# Patient Record
Sex: Male | Born: 1965 | Race: Black or African American | Hispanic: No | Marital: Single | Smoking: Never smoker
Health system: Southern US, Community
[De-identification: ages and names within clinical notes are randomized; demographics above are authoritative.]

## PROBLEM LIST (undated history)

## (undated) DIAGNOSIS — N289 Disorder of kidney and ureter, unspecified: Secondary | ICD-10-CM

## (undated) DIAGNOSIS — K469 Unspecified abdominal hernia without obstruction or gangrene: Secondary | ICD-10-CM

---

## 2001-08-13 ENCOUNTER — Encounter: Payer: Self-pay | Admitting: Emergency Medicine

## 2001-08-13 ENCOUNTER — Emergency Department (HOSPITAL_COMMUNITY): Admission: EM | Admit: 2001-08-13 | Discharge: 2001-08-13 | Payer: Self-pay | Admitting: Emergency Medicine

## 2001-08-18 ENCOUNTER — Encounter: Payer: Self-pay | Admitting: Urology

## 2001-08-18 ENCOUNTER — Ambulatory Visit (HOSPITAL_COMMUNITY): Admission: RE | Admit: 2001-08-18 | Discharge: 2001-08-18 | Payer: Self-pay | Admitting: Urology

## 2006-10-24 ENCOUNTER — Emergency Department (HOSPITAL_COMMUNITY): Admission: EM | Admit: 2006-10-24 | Discharge: 2006-10-24 | Payer: Self-pay | Admitting: Emergency Medicine

## 2009-03-01 ENCOUNTER — Emergency Department (HOSPITAL_COMMUNITY): Admission: EM | Admit: 2009-03-01 | Discharge: 2009-03-02 | Payer: Self-pay | Admitting: Emergency Medicine

## 2011-03-28 ENCOUNTER — Ambulatory Visit (HOSPITAL_COMMUNITY)
Admission: RE | Admit: 2011-03-28 | Discharge: 2011-03-28 | Disposition: A | Discharge status: Home / Self Care | Payer: BC Managed Care – PPO | Source: Ambulatory Visit | Attending: Internal Medicine | Admitting: Internal Medicine

## 2011-03-28 ENCOUNTER — Other Ambulatory Visit (HOSPITAL_COMMUNITY): Payer: Self-pay | Admitting: Internal Medicine

## 2011-03-28 DIAGNOSIS — R0789 Other chest pain: Secondary | ICD-10-CM | POA: Insufficient documentation

## 2011-04-01 ENCOUNTER — Emergency Department (HOSPITAL_COMMUNITY)
Admission: EM | Admit: 2011-04-01 | Discharge: 2011-04-01 | Disposition: A | Discharge status: Home / Self Care | Payer: BC Managed Care – PPO | Attending: Emergency Medicine | Admitting: Emergency Medicine

## 2011-04-01 ENCOUNTER — Emergency Department (HOSPITAL_COMMUNITY): Payer: BC Managed Care – PPO

## 2011-04-01 DIAGNOSIS — Z79899 Other long term (current) drug therapy: Secondary | ICD-10-CM | POA: Insufficient documentation

## 2011-04-01 DIAGNOSIS — R079 Chest pain, unspecified: Secondary | ICD-10-CM | POA: Insufficient documentation

## 2011-04-01 DIAGNOSIS — F17200 Nicotine dependence, unspecified, uncomplicated: Secondary | ICD-10-CM | POA: Insufficient documentation

## 2011-04-01 LAB — POCT CARDIAC MARKERS
CKMB, poc: 4.1 ng/mL (ref 1.0–8.0)
Troponin i, poc: <0.05 ng/mL (ref 0.00–0.09)

## 2018-03-01 ENCOUNTER — Other Ambulatory Visit (HOSPITAL_COMMUNITY): Payer: Self-pay | Admitting: Preventative Medicine

## 2018-03-01 DIAGNOSIS — R197 Diarrhea, unspecified: Secondary | ICD-10-CM

## 2018-03-01 DIAGNOSIS — R1032 Left lower quadrant pain: Secondary | ICD-10-CM

## 2018-03-03 ENCOUNTER — Other Ambulatory Visit: Payer: Self-pay

## 2018-03-03 ENCOUNTER — Encounter (HOSPITAL_COMMUNITY): Payer: Self-pay | Admitting: Emergency Medicine

## 2018-03-03 ENCOUNTER — Emergency Department (HOSPITAL_COMMUNITY): Payer: BC Managed Care – PPO

## 2018-03-03 ENCOUNTER — Emergency Department (HOSPITAL_COMMUNITY)
Admission: EM | Admit: 2018-03-03 | Discharge: 2018-03-03 | Disposition: A | Discharge status: Other Acute Inpatient Hospital | Payer: BC Managed Care – PPO | Attending: Emergency Medicine | Admitting: Emergency Medicine

## 2018-03-03 DIAGNOSIS — R0602 Shortness of breath: Secondary | ICD-10-CM | POA: Insufficient documentation

## 2018-03-03 DIAGNOSIS — R0789 Other chest pain: Secondary | ICD-10-CM

## 2018-03-03 DIAGNOSIS — D72829 Elevated white blood cell count, unspecified: Secondary | ICD-10-CM | POA: Diagnosis not present

## 2018-03-03 HISTORY — DX: Unspecified abdominal hernia without obstruction or gangrene: K46.9

## 2018-03-03 HISTORY — DX: Disorder of kidney and ureter, unspecified: N28.9

## 2018-03-03 LAB — BASIC METABOLIC PANEL
Anion gap: 12 (ref 5–15)
BUN: 17 mg/dL (ref 6–20)
CALCIUM: 9 mg/dL (ref 8.9–10.3)
CO2: 22 mmol/L (ref 22–32)
CREATININE: 1.11 mg/dL (ref 0.61–1.24)
Chloride: 106 mmol/L (ref 101–111)
GFR calc Af Amer: >60 mL/min (ref >60)
GFR calc non Af Amer: >60 mL/min (ref >60)
GLUCOSE: 110 mg/dL — AB (ref 65–99)
Potassium: 3.3 mmol/L — ABNORMAL LOW (ref 3.5–5.1)
Sodium: 140 mmol/L (ref 135–145)

## 2018-03-03 LAB — CBC
HCT: 33.6 % — ABNORMAL LOW (ref 39.0–52.0)
Hemoglobin: 11.1 g/dL — ABNORMAL LOW (ref 13.0–17.0)
MCH: 31.4 pg (ref 26.0–34.0)
MCHC: 33 g/dL (ref 30.0–36.0)
MCV: 94.9 fL (ref 78.0–100.0)
PLATELETS: 470 10*3/uL — AB (ref 150–400)
RBC: 3.54 MIL/uL — ABNORMAL LOW (ref 4.22–5.81)
RDW: 17.6 % — ABNORMAL HIGH (ref 11.5–15.5)
WBC: 364.6 10*3/uL (ref 4.0–10.5)

## 2018-03-03 LAB — DIFFERENTIAL
BAND NEUTROPHILS: 9 %
Basophils Absolute: 0 10*3/uL (ref 0.0–0.1)
Basophils Relative: 0 %
Blasts: 0 %
EOS ABS: 3.6 10*3/uL — AB (ref 0.0–0.7)
Eosinophils Relative: 1 %
LYMPHS ABS: 10.9 10*3/uL — AB (ref 0.7–4.0)
LYMPHS PCT: 3 %
MYELOCYTES: 5 %
Metamyelocytes Relative: 4 %
Monocytes Absolute: 0 10*3/uL — ABNORMAL LOW (ref 0.1–1.0)
Monocytes Relative: 0 %
Neutro Abs: 288 10*3/uL — ABNORMAL HIGH (ref 1.7–7.7)
Neutrophils Relative %: 40 %
OTHER: 17 %
Promyelocytes Relative: 21 %
nRBC: 1 /100 WBC — ABNORMAL HIGH

## 2018-03-03 LAB — I-STAT TROPONIN, ED: TROPONIN I, POC: 0 ng/mL (ref 0.00–0.08)

## 2018-03-03 LAB — BRAIN NATRIURETIC PEPTIDE: B NATRIURETIC PEPTIDE 5: 68 pg/mL (ref 0.0–100.0)

## 2018-03-03 LAB — HEPATIC FUNCTION PANEL
ALT: 88 U/L — ABNORMAL HIGH (ref 17–63)
AST: 80 U/L — ABNORMAL HIGH (ref 15–41)
Albumin: 3.7 g/dL (ref 3.5–5.0)
Alkaline Phosphatase: 312 U/L — ABNORMAL HIGH (ref 38–126)
Bilirubin, Direct: 0.7 mg/dL — ABNORMAL HIGH (ref 0.1–0.5)
Indirect Bilirubin: 1.2 mg/dL — ABNORMAL HIGH (ref 0.3–0.9)
Total Bilirubin: 1.9 mg/dL — ABNORMAL HIGH (ref 0.3–1.2)
Total Protein: 6.3 g/dL — ABNORMAL LOW (ref 6.5–8.1)

## 2018-03-03 LAB — LACTIC ACID, PLASMA: LACTIC ACID, VENOUS: 1.3 mmol/L (ref 0.5–1.9)

## 2018-03-03 MED ORDER — IOPAMIDOL (ISOVUE-300) INJECTION 61%: 75.0000 mL | Freq: Once | INTRAVENOUS | Status: DC | PRN

## 2018-03-03 MED ORDER — LEVOFLOXACIN IN D5W 500 MG/100ML IV SOLN
500.0000 mg | Freq: Once | INTRAVENOUS | Status: AC
  Administered 2018-03-03: 500 mg via INTRAVENOUS
  Filled 2018-03-03: qty 100

## 2018-03-03 MED ORDER — IOPAMIDOL (ISOVUE-300) INJECTION 61%
75.0000 mL | Freq: Once | INTRAVENOUS | Status: AC | PRN
  Administered 2018-03-03: 75 mL via INTRAVENOUS

## 2018-03-03 NOTE — ED Notes (Signed)
Patient transported to X-ray 

## 2018-03-03 NOTE — ED Notes (Signed)
Girlfriend came to desk and states pt is refusing to be transferred. Pt advised of risk , edp aware.

## 2018-03-03 NOTE — ED Notes (Addendum)
CRITICAL VALUE ALERT  Critical Value:  WBC 364.6 and immature cells/blasts cells  Date & Time Notied:  03/03/18  1047  Provider Notified: Dr Roderic Palau  Orders Received/Actions taken:

## 2018-03-03 NOTE — ED Notes (Signed)
Warmed up lunch tray for patient consumption.

## 2018-03-03 NOTE — ED Triage Notes (Signed)
Pt c/o of central chest pain starting at 8 am this morning with at rest.

## 2018-03-03 NOTE — ED Provider Notes (Signed)
[facility] EMERGENCY DEPARTMENT Provider Note   CSN: [id number] Arrival date & time: 03/03/18  0626     History   Chief Complaint Chief Complaint  Patient presents with  . Chest Pain    HPI WASIF SIMONICH is a 52 y.o. male.  Patient complains of chest pain and shortness of breath and some fevers and chills.  The history is provided by the patient.  Illness  This is a new problem. The current episode started more than 1 week ago. The problem occurs constantly. Associated symptoms include chest pain and shortness of breath. Pertinent negatives include no abdominal pain and no headaches. Nothing aggravates the symptoms. Nothing relieves the symptoms. He has tried nothing for the symptoms. The treatment provided no relief.    Past Medical History:  Diagnosis Date  . Hernia, abdominal   . Renal disorder     There are no active problems to display for this patient.   History reviewed. No pertinent surgical history.      Home Medications    Prior to Admission medications   Medication Sig Start Date End Date Taking? Authorizing Provider  ibuprofen (ADVIL,MOTRIN) 400 MG tablet Take 400 mg by mouth every 6 (six) hours as needed.   Yes [provider]    Family History No family history on file.  Social History Social History   Tobacco Use  . Smoking status: Never Smoker  . Smokeless tobacco: Never Used  Substance Use Topics  . Alcohol use: Yes    Comment: occ  . Drug use: Never     Allergies   Patient has no allergy information on record.   Review of Systems Review of Systems  Constitutional: Positive for fatigue and fever. Negative for appetite change.  HENT: Negative for congestion, ear discharge and sinus pressure.   Eyes: Negative for discharge.  Respiratory: Positive for shortness of breath. Negative for cough.   Cardiovascular: Positive for chest pain.  Gastrointestinal: Negative for abdominal pain and diarrhea.  Genitourinary: Negative  for frequency and hematuria.  Musculoskeletal: Negative for back pain.  Skin: Negative for rash.  Neurological: Negative for seizures and headaches.  Psychiatric/Behavioral: Negative for hallucinations.     Physical Exam Updated Vital Signs BP (!) 138/93   Pulse (!) 109   Temp 97.6 F (36.4 C) (Oral)   Resp (!) 23   Ht 5\' 7"  (1.702 m)   Wt 104.3 kg (230 lb)   SpO2 (!) 88%   BMI 36.02 kg/m   Physical Exam  Constitutional: He is oriented to person, place, and time. He appears well-developed.  HENT:  Head: Normocephalic.  Eyes: Conjunctivae and EOM are normal. No scleral icterus.  Neck: Neck supple. No thyromegaly present.  Cardiovascular: Normal rate and regular rhythm. Exam reveals no gallop and no friction rub.  No murmur heard. Pulmonary/Chest: No stridor. He has no wheezes. He has no rales. He exhibits no tenderness.  Abdominal: He exhibits no distension. There is no tenderness. There is no rebound.  Musculoskeletal: Normal range of motion. He exhibits no edema.  Lymphadenopathy:    He has no cervical adenopathy.  Neurological: He is oriented to person, place, and time. He exhibits normal muscle tone. Coordination normal.  Skin: No rash noted. No erythema.  Psychiatric: He has a normal mood and affect. His behavior is normal.     ED Treatments / Results  Labs (all labs ordered are listed, but only abnormal results are displayed) Labs Reviewed  BASIC METABOLIC PANEL - Abnormal;  Notable for the following components:      Result Value   Potassium 3.3 (*)    Glucose, Bld 110 (*)    All other components within normal limits  CBC - Abnormal; Notable for the following components:   WBC 364.6 (*)    RBC 3.54 (*)    Hemoglobin 11.1 (*)    HCT 33.6 (*)    RDW 17.6 (*)    Platelets 470 (*)    All other components within normal limits  DIFFERENTIAL - Abnormal; Notable for the following components:   nRBC 1 (*)    Neutro Abs 288.0 (*)    Lymphs Abs 10.9 (*)     Monocytes Absolute 0.0 (*)    Eosinophils Absolute 3.6 (*)    All other components within normal limits  HEPATIC FUNCTION PANEL - Abnormal; Notable for the following components:   Total Protein 6.3 (*)    AST 80 (*)    ALT 88 (*)    Alkaline Phosphatase 312 (*)    Total Bilirubin 1.9 (*)    Bilirubin, Direct 0.7 (*)    Indirect Bilirubin 1.2 (*)    All other components within normal limits  CULTURE, BLOOD (ROUTINE X 2)  CULTURE, BLOOD (ROUTINE X 2)  LACTIC ACID, PLASMA  BRAIN NATRIURETIC PEPTIDE  PATHOLOGIST SMEAR REVIEW  I-STAT TROPONIN, ED    EKG EKG Interpretation  Date/Time:  Wednesday March 03 2018 09:57:23 EDT Ventricular Rate:  111 PR Interval:    QRS Duration: 90 QT Interval:  346 QTC Calculation: 471 R Axis:   140 Text Interpretation:  Sinus tachycardia Biatrial enlargement Right ventricular hypertrophy Nonspecific T abnormalities, lateral leads Baseline wander in lead(s) II III aVR aVF Confirmed by Milton Ferguson [phone]) on 03/03/2018 10:31:28 AM   Radiology Dg Chest 2 View  Result Date: 03/03/2018 CLINICAL DATA:  52 year old male with cough and shortness of breath this week. Intermittent chest pain. EXAM: CHEST - 2 VIEW COMPARISON:  Chest radiographs 04/01/2011 and earlier. FINDINGS: PA and lateral views. Cardiac silhouette is increased since 2012 compatible with mild to moderate cardiomegaly now. Other mediastinal contours are within normal limits. Lung volumes are large, and there is new widespread bilateral pulmonary interstitial opacity. No pneumothorax, pleural effusion or consolidation. Visualized tracheal air column is within normal limits. No acute osseous abnormality identified. Negative visible bowel gas pattern. IMPRESSION: 1. Diffuse bilateral pulmonary interstitial opacity is new since 2012. Consider viral/atypical respiratory infection. Chronic interstitial lung disease felt less likely. No pleural effusion. 2. New mild to moderate cardiomegaly since 2012.  Electronically Signed   By: Genevie Ann M.D.   On: 03/03/2018 10:53   Ct Chest W Contrast  Result Date: 03/03/2018 CLINICAL DATA:  52 year old male with history of central chest pain since 8 a.m. this morning at rest. Cough and congestion for the past 2 weeks. EXAM: CT CHEST WITH CONTRAST TECHNIQUE: Multidetector CT imaging of the chest was performed during intravenous contrast administration. CONTRAST:  19mL ISOVUE-300 IOPAMIDOL (ISOVUE-300) INJECTION 61% COMPARISON:  No priors. FINDINGS: Cardiovascular: Heart size is mildly enlarged. There is no significant pericardial fluid, thickening or pericardial calcification. No atherosclerotic calcifications are noted in the thoracic aorta or the coronary arteries. Mediastinum/Nodes: Multiple prominent borderline enlarged mediastinal and bilateral hilar lymph nodes measuring up to 12 mm in short axis in the right paratracheal nodal stations and up to 14 mm in the left hilar nodal station. Esophagus is unremarkable in appearance. Multiple prominent borderline enlarged axillary lymph nodes are also noted (nonspecific).  Lungs/Pleura: Diffuse but patchy areas of ground-glass attenuation scattered throughout the lungs bilaterally, most severe throughout the mid to upper lungs. No associated septal thickening. Scattered areas of scarring or subsegmental atelectasis in the upper lobes of the lungs bilaterally. No pleural effusions. A few small pulmonary nodules are noted, measuring up to 6 x 4 mm (5 mm mean diameter) in the left lower lobe (axial image 106 of series 4). Upper Abdomen: Diffuse low-attenuation throughout the visualized hepatic parenchyma, strongly suggestive of hepatic steatosis (difficult to confirm on today's noncontrast CT examination). Liver also has a slightly nodular contour, suggesting concurrent cirrhosis. Trace volume of perihepatic ascites. Spleen is incompletely visualize, but clearly enlarged measuring 17.6 x 6.2 cm. Musculoskeletal: Bilateral  gynecomastia. There are no aggressive appearing lytic or blastic lesions noted in the visualized portions of the skeleton. IMPRESSION: 1. Unusual appearance of the lung parenchyma with diffuse but patchy areas of ground-glass attenuation which have a mid to upper lung predominance. Differential considerations are broad, but include atypical infectious etiologies including pneumocystis jirovecii pneumonia. 2. In addition, there is cardiomegaly, but no atherosclerotic calcifications in the thoracic aorta or the coronary arteries. The possibility of nonischemic cardiomyopathy should be considered. This can be seen in the setting of systemic disease such as HIV infection or sarcoidosis. 3. Multiple borderline enlarged lymph nodes throughout the mediastinal and bilateral hilar nodal stations, as well as the axillary regions bilaterally. This also can be seen in the setting of chronic systemic disease such as HIV or sarcoidosis. The possibility of lymphoproliferative disorder could also be considered. Further evaluation is recommended. 4. Cirrhosis with splenomegaly, suggesting associated portal venous hypertension. 5. Bilateral gynecomastia. 6. Trace volume of ascites. Electronically Signed   By: Vinnie Langton M.D.   On: 03/03/2018 12:28    Procedures Procedures (including critical care time)  Medications Ordered in ED Medications  iopamidol (ISOVUE-300) 61 % injection 75 mL (has no administration in time range)  levofloxacin (LEVAQUIN) IVPB 500 mg (0 mg Intravenous Stopped 03/03/18 1348)  iopamidol (ISOVUE-300) 61 % injection 75 mL (75 mLs Intravenous Contrast Given 03/03/18 1140)    CRITICAL CARE Performed by: Milton Ferguson Total critical care time: 40 minutes Critical care time was exclusive of separately billable procedures and treating other patients. Critical care was necessary to treat or prevent imminent or life-threatening deterioration. Critical care was time spent personally by me on the  following activities: development of treatment plan with patient and/or surrogate as well as nursing, discussions with consultants, evaluation of patient's response to treatment, examination of patient, obtaining history from patient or surrogate, ordering and performing treatments and interventions, ordering and review of laboratory studies, ordering and review of radiographic studies, pulse oximetry and re-evaluation of patient's condition.  Initial Impression / Assessment and Plan / ED Course  I have reviewed the triage vital signs and the nursing notes.  Pertinent labs & imaging results that were available during my care of the patient were reviewed by me and considered in my medical decision making (see chart for details). 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  Chest x-ray and CT scan of his chest suggests infectious agent possibly atypical infection or fungus.  I spoke with the hematologist oncologist over at [facility] Dr. Joan Mayans and the patient is accepted to [facility].    Final Clinical Impressions(s) / ED Diagnoses   Final diagnoses:  None    ED Discharge Orders    None       Milton Ferguson, MD  03/03/18 1542  

## 2018-03-03 NOTE — ED Notes (Signed)
Called [facility] for Transportation to [facility].

## 2018-03-03 NOTE — ED Notes (Signed)
Patient transported back from X-ray 

## 2018-03-03 NOTE — ED Notes (Signed)
In with edp to talk to pt and girlfriend. Advised of transfer. Pt signed consent form.

## 2018-03-05 LAB — PATHOLOGIST SMEAR REVIEW

## 2018-03-08 LAB — CULTURE, BLOOD (ROUTINE X 2)
CULTURE: NO GROWTH
Culture: NO GROWTH
SPECIAL REQUESTS: ADEQUATE

## 2018-03-15 ENCOUNTER — Other Ambulatory Visit (HOSPITAL_COMMUNITY): Payer: Self-pay

## 2018-03-15 DIAGNOSIS — C9210 Chronic myeloid leukemia, BCR/ABL-positive, not having achieved remission: Secondary | ICD-10-CM

## 2018-03-22 ENCOUNTER — Inpatient Hospital Stay (HOSPITAL_COMMUNITY): Payer: BC Managed Care – PPO | Attending: Hematology

## 2018-03-22 ENCOUNTER — Other Ambulatory Visit (HOSPITAL_COMMUNITY)
Admission: RE | Admit: 2018-03-22 | Discharge: 2018-03-22 | Disposition: A | Discharge status: Home / Self Care | Payer: BC Managed Care – PPO | Source: Ambulatory Visit | Attending: Hematology and Oncology | Admitting: Hematology and Oncology

## 2018-03-22 DIAGNOSIS — C9210 Chronic myeloid leukemia, BCR/ABL-positive, not having achieved remission: Secondary | ICD-10-CM

## 2018-03-22 DIAGNOSIS — C9000 Multiple myeloma not having achieved remission: Secondary | ICD-10-CM | POA: Diagnosis present

## 2018-03-22 DIAGNOSIS — D500 Iron deficiency anemia secondary to blood loss (chronic): Secondary | ICD-10-CM | POA: Diagnosis not present

## 2018-03-22 DIAGNOSIS — C9220 Atypical chronic myeloid leukemia, BCR/ABL-negative, not having achieved remission: Secondary | ICD-10-CM | POA: Insufficient documentation

## 2018-03-22 LAB — COMPREHENSIVE METABOLIC PANEL
ALT: 30 U/L (ref 17–63)
AST: 25 U/L (ref 15–41)
Albumin: 3.7 g/dL (ref 3.5–5.0)
Alkaline Phosphatase: 128 U/L — ABNORMAL HIGH (ref 38–126)
Anion gap: 6 (ref 5–15)
BUN: 17 mg/dL (ref 6–20)
CHLORIDE: 104 mmol/L (ref 101–111)
CO2: 29 mmol/L (ref 22–32)
Calcium: 8.6 mg/dL — ABNORMAL LOW (ref 8.9–10.3)
Creatinine, Ser: 0.88 mg/dL (ref 0.61–1.24)
Glucose, Bld: 129 mg/dL — ABNORMAL HIGH (ref 65–99)
POTASSIUM: 3.9 mmol/L (ref 3.5–5.1)
Sodium: 139 mmol/L (ref 135–145)
Total Bilirubin: 0.9 mg/dL (ref 0.3–1.2)
Total Protein: 6.4 g/dL — ABNORMAL LOW (ref 6.5–8.1)

## 2018-03-22 LAB — CBC WITH DIFFERENTIAL/PLATELET
BASOS PCT: 4 %
Basophils Absolute: 0.3 10*3/uL — ABNORMAL HIGH (ref 0.0–0.1)
EOS ABS: 0.2 10*3/uL (ref 0.0–0.7)
Eosinophils Relative: 2 %
HCT: 33.1 % — ABNORMAL LOW (ref 39.0–52.0)
Hemoglobin: 10.1 g/dL — ABNORMAL LOW (ref 13.0–17.0)
Lymphocytes Relative: 11 %
Lymphs Abs: 0.8 10*3/uL (ref 0.7–4.0)
MCH: 29.8 pg (ref 26.0–34.0)
MCHC: 30.5 g/dL (ref 30.0–36.0)
MCV: 97.6 fL (ref 78.0–100.0)
MONO ABS: 0.3 10*3/uL (ref 0.1–1.0)
Monocytes Relative: 4 %
NEUTROS PCT: 79 %
Neutro Abs: 5.9 10*3/uL (ref 1.7–7.7)
PLATELETS: 321 10*3/uL (ref 150–400)
RBC: 3.39 MIL/uL — ABNORMAL LOW (ref 4.22–5.81)
RDW: 18.7 % — AB (ref 11.5–15.5)
WBC: 7.5 10*3/uL (ref 4.0–10.5)

## 2018-03-22 LAB — URIC ACID: URIC ACID, SERUM: 6.2 mg/dL (ref 4.4–7.6)

## 2018-03-22 LAB — LACTATE DEHYDROGENASE: LDH: 201 U/L — AB (ref 98–192)

## 2018-03-29 ENCOUNTER — Other Ambulatory Visit (HOSPITAL_COMMUNITY): Payer: Self-pay

## 2018-03-29 ENCOUNTER — Inpatient Hospital Stay (HOSPITAL_COMMUNITY): Payer: BC Managed Care – PPO

## 2018-03-29 DIAGNOSIS — D500 Iron deficiency anemia secondary to blood loss (chronic): Secondary | ICD-10-CM

## 2018-03-29 DIAGNOSIS — C9000 Multiple myeloma not having achieved remission: Secondary | ICD-10-CM

## 2018-03-29 LAB — CBC WITH DIFFERENTIAL/PLATELET
BASOS PCT: 1 %
Basophils Absolute: 0 10*3/uL (ref 0.0–0.1)
EOS ABS: 0.1 10*3/uL (ref 0.0–0.7)
EOS PCT: 2 %
HCT: 34.4 % — ABNORMAL LOW (ref 39.0–52.0)
HEMOGLOBIN: 10.4 g/dL — AB (ref 13.0–17.0)
LYMPHS ABS: 0.7 10*3/uL (ref 0.7–4.0)
Lymphocytes Relative: 18 %
MCH: 29.6 pg (ref 26.0–34.0)
MCHC: 30.2 g/dL (ref 30.0–36.0)
MCV: 98 fL (ref 78.0–100.0)
MONOS PCT: 6 %
Monocytes Absolute: 0.3 10*3/uL (ref 0.1–1.0)
NEUTROS PCT: 73 %
Neutro Abs: 2.9 10*3/uL (ref 1.7–7.7)
PLATELETS: 192 10*3/uL (ref 150–400)
RBC: 3.51 MIL/uL — ABNORMAL LOW (ref 4.22–5.81)
RDW: 19.4 % — AB (ref 11.5–15.5)
WBC: 3.9 10*3/uL — ABNORMAL LOW (ref 4.0–10.5)

## 2018-03-29 LAB — COMPREHENSIVE METABOLIC PANEL
ALBUMIN: 3.7 g/dL (ref 3.5–5.0)
ALT: 25 U/L (ref 17–63)
AST: 24 U/L (ref 15–41)
Alkaline Phosphatase: 112 U/L (ref 38–126)
Anion gap: 7 (ref 5–15)
BUN: 15 mg/dL (ref 6–20)
CHLORIDE: 102 mmol/L (ref 101–111)
CO2: 30 mmol/L (ref 22–32)
CREATININE: 0.85 mg/dL (ref 0.61–1.24)
Calcium: 8.5 mg/dL — ABNORMAL LOW (ref 8.9–10.3)
GFR calc Af Amer: >60 mL/min (ref >60)
GFR calc non Af Amer: >60 mL/min (ref >60)
GLUCOSE: 159 mg/dL — AB (ref 65–99)
Potassium: 4.1 mmol/L (ref 3.5–5.1)
SODIUM: 139 mmol/L (ref 135–145)
Total Bilirubin: 0.7 mg/dL (ref 0.3–1.2)
Total Protein: 6.3 g/dL — ABNORMAL LOW (ref 6.5–8.1)

## 2018-03-29 LAB — URIC ACID: Uric Acid, Serum: 5.7 mg/dL (ref 4.4–7.6)

## 2018-03-29 LAB — LACTATE DEHYDROGENASE: LDH: 157 U/L (ref 98–192)

## 2018-04-28 ENCOUNTER — Other Ambulatory Visit (HOSPITAL_COMMUNITY)
Admission: RE | Admit: 2018-04-28 | Discharge: 2018-04-28 | Disposition: A | Discharge status: Home / Self Care | Payer: BC Managed Care – PPO | Source: Ambulatory Visit | Attending: Internal Medicine | Admitting: Internal Medicine

## 2018-04-28 DIAGNOSIS — I5022 Chronic systolic (congestive) heart failure: Secondary | ICD-10-CM | POA: Diagnosis not present

## 2018-04-28 LAB — BASIC METABOLIC PANEL
Anion gap: 6 (ref 5–15)
BUN: 14 mg/dL (ref 6–20)
CHLORIDE: 103 mmol/L (ref 101–111)
CO2: 29 mmol/L (ref 22–32)
CREATININE: 0.84 mg/dL (ref 0.61–1.24)
Calcium: 9.2 mg/dL (ref 8.9–10.3)
GFR calc non Af Amer: >60 mL/min (ref >60)
Glucose, Bld: 119 mg/dL — ABNORMAL HIGH (ref 65–99)
POTASSIUM: 4.2 mmol/L (ref 3.5–5.1)
Sodium: 138 mmol/L (ref 135–145)

## 2018-05-03 ENCOUNTER — Other Ambulatory Visit (HOSPITAL_COMMUNITY)
Admission: RE | Admit: 2018-05-03 | Discharge: 2018-05-03 | Disposition: A | Discharge status: Home / Self Care | Payer: BC Managed Care – PPO | Source: Ambulatory Visit | Attending: Hematology and Oncology | Admitting: Hematology and Oncology

## 2018-05-03 DIAGNOSIS — C9210 Chronic myeloid leukemia, BCR/ABL-positive, not having achieved remission: Secondary | ICD-10-CM | POA: Insufficient documentation

## 2018-05-03 LAB — COMPREHENSIVE METABOLIC PANEL
ALBUMIN: 4 g/dL (ref 3.5–5.0)
ALK PHOS: 117 U/L (ref 38–126)
ALT: 21 U/L (ref 17–63)
AST: 25 U/L (ref 15–41)
Anion gap: 7 (ref 5–15)
BILIRUBIN TOTAL: 0.7 mg/dL (ref 0.3–1.2)
BUN: 17 mg/dL (ref 6–20)
CALCIUM: 8.9 mg/dL (ref 8.9–10.3)
CO2: 28 mmol/L (ref 22–32)
CREATININE: 1.06 mg/dL (ref 0.61–1.24)
Chloride: 104 mmol/L (ref 101–111)
GFR calc non Af Amer: >60 mL/min (ref >60)
GLUCOSE: 129 mg/dL — AB (ref 65–99)
Potassium: 4.2 mmol/L (ref 3.5–5.1)
SODIUM: 139 mmol/L (ref 135–145)
Total Protein: 6.8 g/dL (ref 6.5–8.1)

## 2018-05-03 LAB — CBC WITH DIFFERENTIAL/PLATELET
Basophils Absolute: 0 10*3/uL (ref 0.0–0.1)
Basophils Relative: 0 %
EOS ABS: 0.1 10*3/uL (ref 0.0–0.7)
Eosinophils Relative: 2 %
HEMATOCRIT: 41.5 % (ref 39.0–52.0)
HEMOGLOBIN: 12.9 g/dL — AB (ref 13.0–17.0)
LYMPHS ABS: 1.4 10*3/uL (ref 0.7–4.0)
Lymphocytes Relative: 25 %
MCH: 29.1 pg (ref 26.0–34.0)
MCHC: 31.1 g/dL (ref 30.0–36.0)
MCV: 93.7 fL (ref 78.0–100.0)
Monocytes Absolute: 0.3 10*3/uL (ref 0.1–1.0)
Monocytes Relative: 5 %
NEUTROS PCT: 68 %
Neutro Abs: 3.9 10*3/uL (ref 1.7–7.7)
Platelets: 294 10*3/uL (ref 150–400)
RBC: 4.43 MIL/uL (ref 4.22–5.81)
RDW: 16 % — ABNORMAL HIGH (ref 11.5–15.5)
WBC: 5.7 10*3/uL (ref 4.0–10.5)

## 2018-05-03 LAB — LACTATE DEHYDROGENASE: LDH: 135 U/L (ref 98–192)

## 2018-05-03 LAB — URIC ACID: Uric Acid, Serum: 5.2 mg/dL (ref 4.4–7.6)

## 2019-09-07 IMAGING — CT CT CHEST W/ CM
2 of 3 series · 15 of 36 positions shown, 18 images · IV contrast (iopamidol)
Comparison: No priors.

CLINICAL DATA: 51-year-old male with history of central chest pain
since 8 a.m. this morning at rest. Cough and congestion for the past
2 weeks.

EXAM:
CT CHEST WITH CONTRAST
TECHNIQUE: Multidetector CT imaging of the chest was performed during
intravenous contrast administration.
CONTRAST:  75mL RG6TAY-AII IOPAMIDOL (RG6TAY-AII) INJECTION 61%

[Series 2: axial st · axial · 0.71mm/px · z∈[-370,-60]mm · 12 of 183 slices shown, 15 images]
[im 14/183  mediastinal]
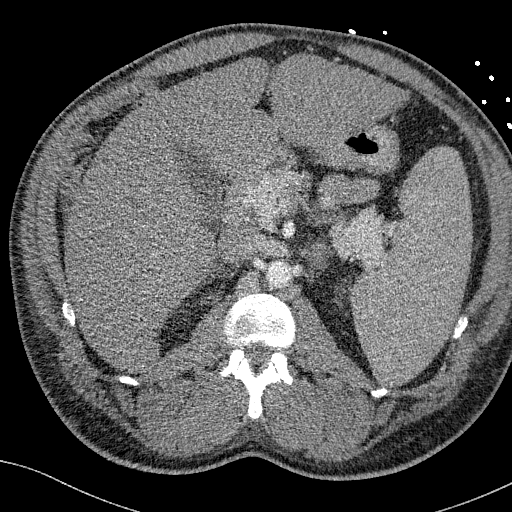
[im 14/183  lung]
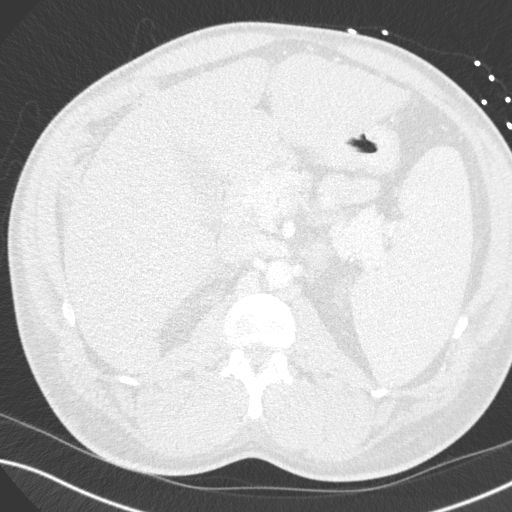
[im 27/183  lung]
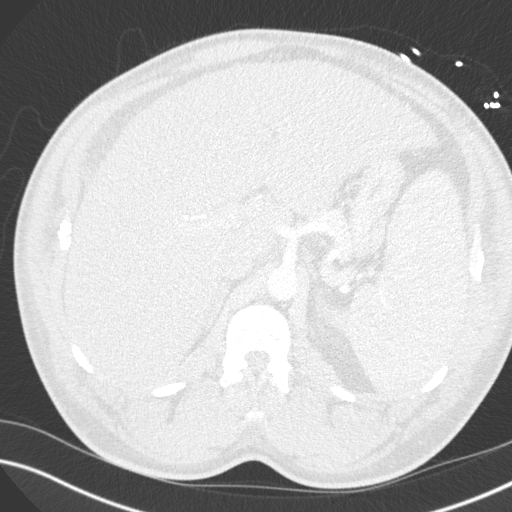
[im 41/183  lung]
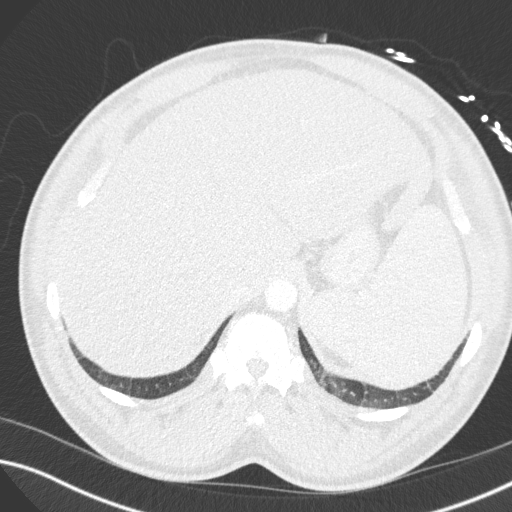
[im 54/183  lung]
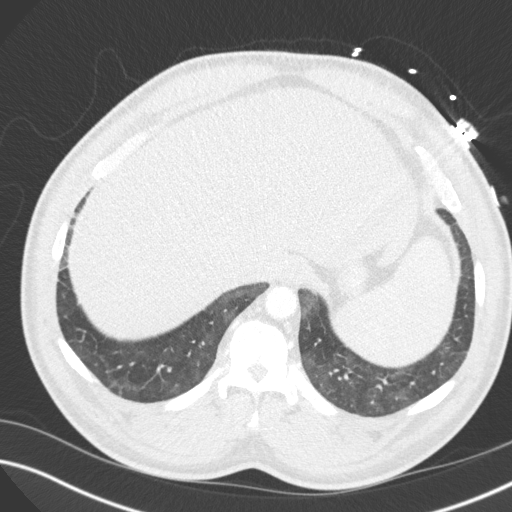
[im 68/183  mediastinal]
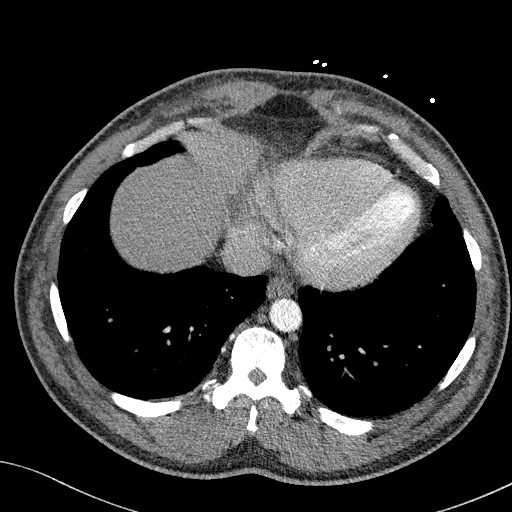
[im 68/183  lung]
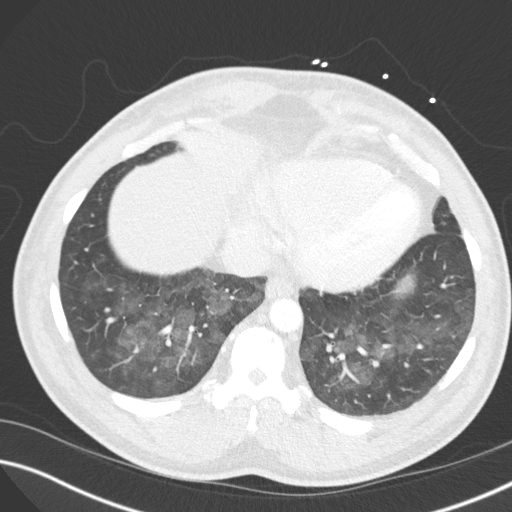
[im 81/183  lung]
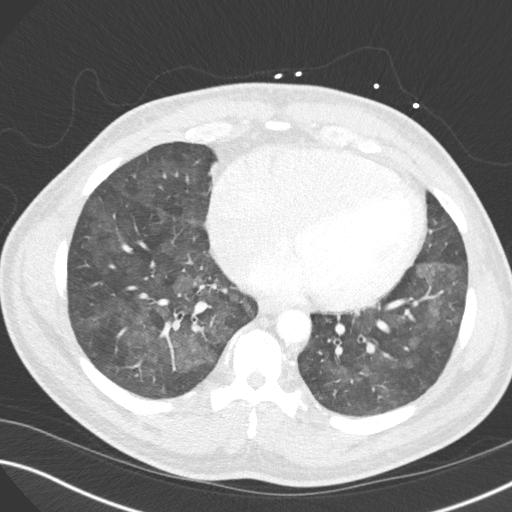
[im 102/183  lung]
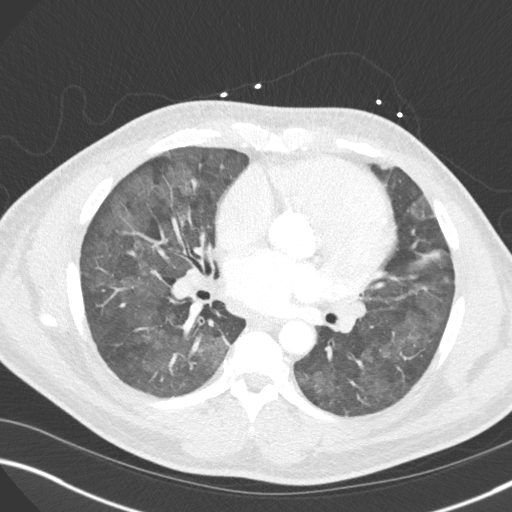
[im 115/183  lung]
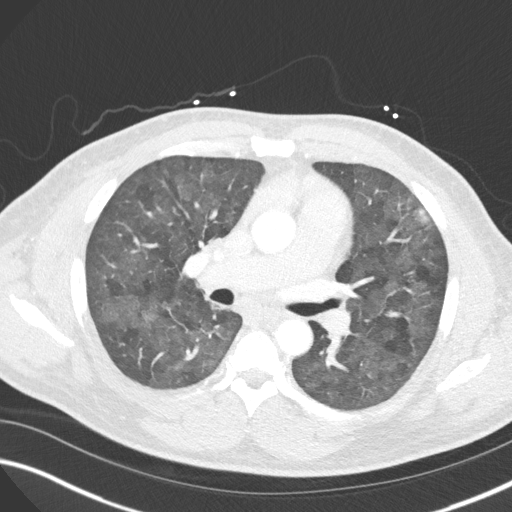
[im 129/183  mediastinal]
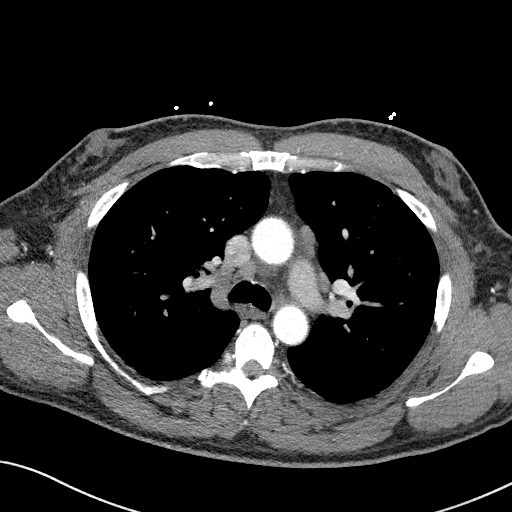
[im 129/183  lung]
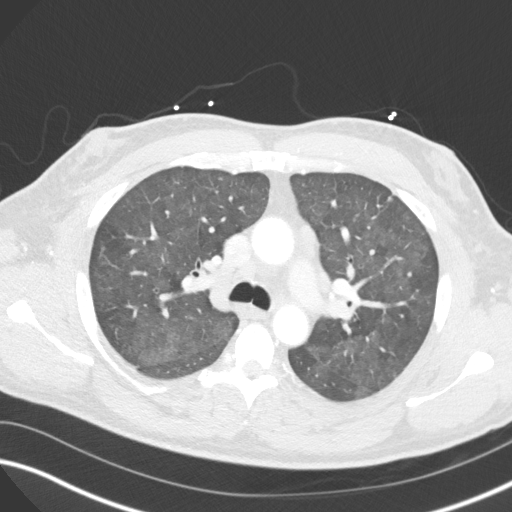
[im 142/183  lung]
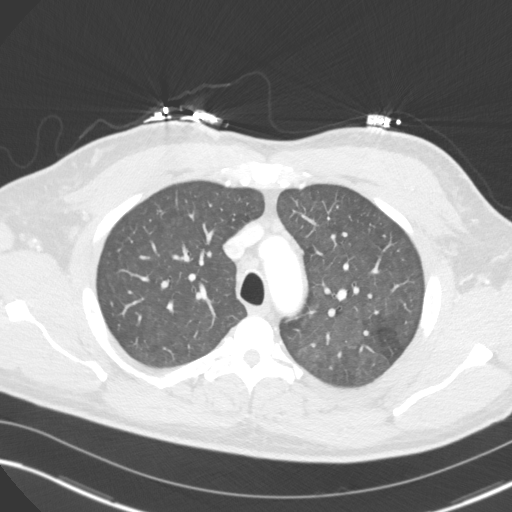
[im 156/183  lung]
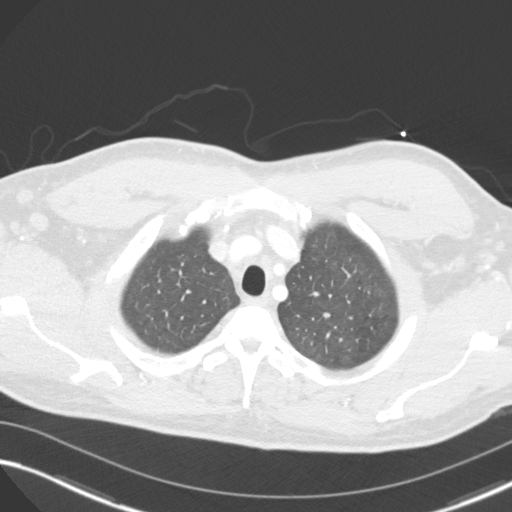
[im 169/183  lung]
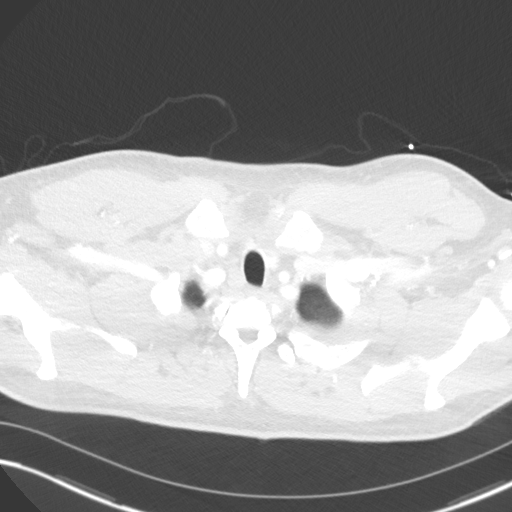

[Series 5: coronal · coronal · 0.71mm/px · 3 of 152 slices shown]
[im 31/152  lung]
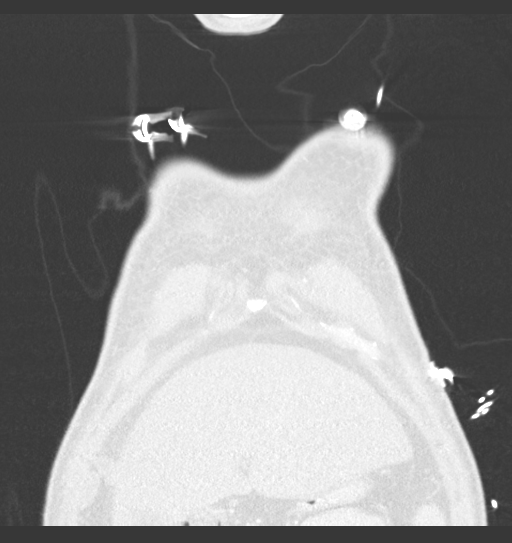
[im 61/152  lung]
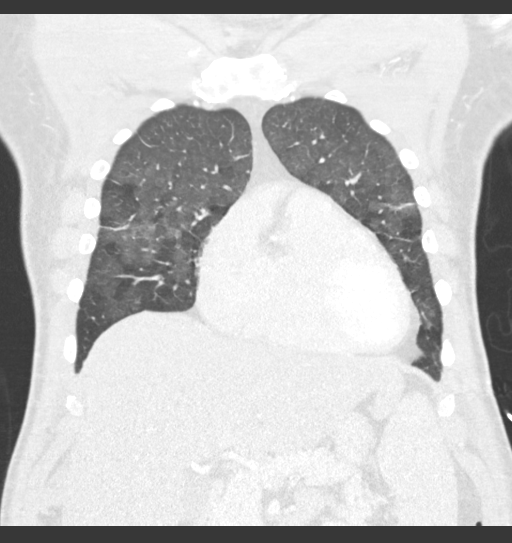
[im 91/152  lung]
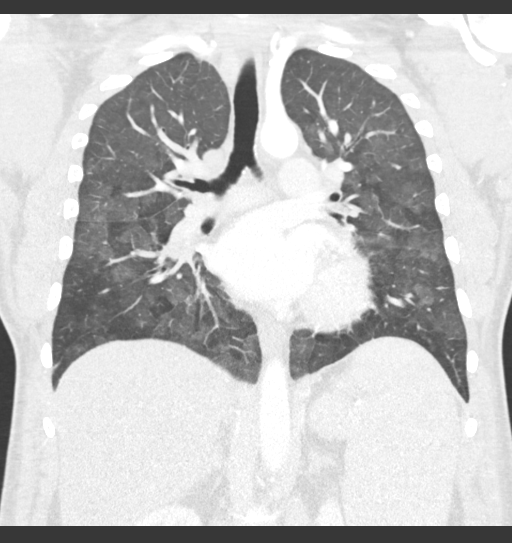

[15 of 36 positions shown; findings below may reference images not displayed]

FINDINGS: Cardiovascular: Heart size is mildly enlarged. There is no
significant pericardial fluid, thickening or pericardial
calcification. No atherosclerotic calcifications are noted in the
thoracic aorta or the coronary arteries.

Mediastinum/Nodes: Multiple prominent borderline enlarged
mediastinal and bilateral hilar lymph nodes measuring up to 12 mm in
short axis in the right paratracheal nodal stations and up to 14 mm
in the left hilar nodal station. Esophagus is unremarkable in
appearance. Multiple prominent borderline enlarged axillary lymph
nodes are also noted (nonspecific).

Lungs/Pleura: Diffuse but patchy areas of ground-glass attenuation
scattered throughout the lungs bilaterally, most severe throughout
the mid to upper lungs. No associated septal thickening. Scattered
areas of scarring or subsegmental atelectasis in the upper lobes of
the lungs bilaterally. No pleural effusions. A few small pulmonary
nodules are noted, measuring up to 6 x 4 mm (5 mm mean diameter) in
the left lower lobe (axial image 106 of series 4).

Upper Abdomen: Diffuse low-attenuation throughout the visualized
hepatic parenchyma, strongly suggestive of hepatic steatosis
(difficult to confirm on today's noncontrast CT examination). Liver
also has a slightly nodular contour, suggesting concurrent
cirrhosis. Trace volume of perihepatic ascites. Spleen is
incompletely visualize, but clearly enlarged measuring 17.6 x
cm.

Musculoskeletal: Bilateral gynecomastia. There are no aggressive
appearing lytic or blastic lesions noted in the visualized portions
of the skeleton.
IMPRESSION: 1. Unusual appearance of the lung parenchyma with diffuse but patchy
areas of ground-glass attenuation which have a mid to upper lung
predominance. Differential considerations are broad, but include
atypical infectious etiologies including pneumocystis jirovecii
pneumonia.
2. In addition, there is cardiomegaly, but no atherosclerotic
calcifications in the thoracic aorta or the coronary arteries. The
possibility of nonischemic cardiomyopathy should be considered. This
can be seen in the setting of systemic disease such as HIV infection
or sarcoidosis.
3. Multiple borderline enlarged lymph nodes throughout the
mediastinal and bilateral hilar nodal stations, as well as the
axillary regions bilaterally. This also can be seen in the setting
of chronic systemic disease such as HIV or sarcoidosis. The
possibility of lymphoproliferative disorder could also be
considered. Further evaluation is recommended.
4. Cirrhosis with splenomegaly, suggesting associated portal venous
hypertension.
5. Bilateral gynecomastia.
6. Trace volume of ascites.
